# Patient Record
Sex: Male | Born: 1950 | Race: Black or African American | Hispanic: No | Marital: Married | Smoking: Former smoker
Health system: Southern US, Community
[De-identification: ages and names within clinical notes are randomized; demographics above are authoritative.]

## PROBLEM LIST (undated history)

## (undated) DIAGNOSIS — C61 Malignant neoplasm of prostate: Secondary | ICD-10-CM

## (undated) HISTORY — DX: Malignant neoplasm of prostate: C61

## (undated) HISTORY — PX: OTHER SURGICAL HISTORY: SHX169

---

## 1997-12-10 ENCOUNTER — Ambulatory Visit (HOSPITAL_COMMUNITY): Admission: RE | Admit: 1997-12-10 | Discharge: 1997-12-10 | Payer: Self-pay | Admitting: Internal Medicine

## 1997-12-10 ENCOUNTER — Encounter: Payer: Self-pay | Admitting: Internal Medicine

## 2013-05-28 ENCOUNTER — Encounter: Payer: Self-pay | Admitting: Radiation Oncology

## 2013-05-28 DIAGNOSIS — C61 Malignant neoplasm of prostate: Secondary | ICD-10-CM

## 2013-05-28 HISTORY — DX: Malignant neoplasm of prostate: C61

## 2013-05-28 NOTE — Progress Notes (Signed)
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 Louis Meckel for evaluation of elevated PSA  Biopsies of prostate (if applicable) revealed:     Past/Anticipated interventions by urology, if any: discussed surveillance, surgery and made radiation referral  Past/Anticipated interventions by medical oncology, if any: None   Weight changes, if any: Denies  Bowel/Bladder complaints, if any: Denies dysuria or hematuria; IPSS 0 at [facility]   Nausea/Vomiting, if any: Denies  Pain issues, if any:  Denies bone pain, new back pain, or lower extremity edema  SAFETY ISSUES:  Prior radiation? NO  Pacemaker/ICD? NO  Possible current pregnancy? N/A  Is the patient on methotrexate? N/A  Current Complaints / other details:  63 year old male. Married. One daughter. Prostate volume 25.36 cc. NKDA

## 2013-05-29 ENCOUNTER — Ambulatory Visit
Admission: RE | Admit: 2013-05-29 | Discharge: 2013-05-29 | Disposition: A | Discharge status: Home / Self Care | Payer: No Typology Code available for payment source | Source: Ambulatory Visit | Attending: Radiation Oncology | Admitting: Radiation Oncology

## 2013-05-29 ENCOUNTER — Encounter: Payer: Self-pay | Admitting: Radiation Oncology

## 2013-05-29 VITALS — BP 127/84 | HR 100 | Temp 98.0°F | Resp 16 | Ht 68.0 in | Wt 228.5 lb

## 2013-05-29 DIAGNOSIS — C61 Malignant neoplasm of prostate: Secondary | ICD-10-CM | POA: Insufficient documentation

## 2013-05-29 DIAGNOSIS — Z79899 Other long term (current) drug therapy: Secondary | ICD-10-CM | POA: Insufficient documentation

## 2013-05-29 NOTE — Progress Notes (Signed)
Radiation Oncology         [phone] ________________________________  Initial outpatient Consultation  Name: Jesus Preston MRN: [id number]  Date: 05/29/2013  DOB: [birth year]  CC:No primary provider on file.  Ardis Hughs, MD   REFERRING PHYSICIAN: Ardis Hughs, MD  DIAGNOSIS: 63 y.o. gentleman with stage T1c adenocarcinoma of the prostate with a Gleason's score of 3+3 and a PSA of 5.99  HISTORY OF PRESENT ILLNESS::Jesus Preston is a 63 y.o. gentleman.  He was noted to have an elevated PSA of 5.99 by his primary care physician, Dr. Jeanann Lewandowsky.  Accordingly, he was referred for evaluation in urology by Dr. Louis Meckel on 04/08/13,  digital rectal examination was performed at that time revealing no nodules.  The patient proceeded to transrectal ultrasound with 12 biopsies of the prostate on 04/30/13.  The prostate volume measured 25.36 cc.  Out of 12 core biopsies, 4 were positive.  The maximum Gleason score was 3+3, and this was seen in the left gland as illustrated below:    The patient reviewed the biopsy results with his urologist and he has kindly been referred today for discussion of potential radiation treatment options.  PREVIOUS RADIATION THERAPY: No  PAST MEDICAL HISTORY:  has a past medical history of Prostate cancer.    PAST SURGICAL HISTORY: Past Surgical History  Procedure Laterality Date  . Prostate biopsy      FAMILY HISTORY: family history is not on file.  SOCIAL HISTORY:  reports that he quit smoking about 29 years ago. His smoking use included Cigarettes. He smoked 0.00 packs per day. He has never used smokeless tobacco. He reports that he drinks alcohol. He reports that he does not use illicit drugs.  ALLERGIES: Review of patient's allergies indicates no known allergies.  MEDICATIONS:  Current Outpatient Prescriptions  Medication Sig Dispense Refill  . Biotin 1000 MCG tablet Take 1,000 mcg by mouth 3 (three) times daily.       No current  facility-administered medications for this encounter.    REVIEW OF SYSTEMS:  A 15 point review of systems is documented in the electronic medical record. This was obtained by the nursing staff. However, I reviewed this with the patient to discuss relevant findings and make appropriate changes.  A comprehensive review of systems was negative..  The patient completed an IPSS and IIEF questionnaire.  His IPSS score was Zero indicating minimal urinary outflow obstructive symptoms.  He indicated that his erectile function is always able to complete sexual activity.   PHYSICAL EXAM: This patient is in no acute distress.  He is alert and oriented.   height is 5\' 8"  (1.727 m) and weight is 228 lb 8 oz (103.647 kg). His oral temperature is 98 F (36.7 C). His blood pressure is 127/84 and his pulse is 100. His respiration is 16 and oxygen saturation is 100%.  He exhibits no respiratory distress or labored breathing.  He appears neurologically intact.  His mood is pleasant.  His affect is appropriate.  Please note the digital rectal exam findings described above.  KPS = 100  100 - Normal; no complaints; no evidence of disease. 90   - Able to carry on normal activity; minor signs or symptoms of disease. 80   - Normal activity with effort; some signs or symptoms of disease. 34   - Cares for self; unable to carry on normal activity or to do active work. 60   - Requires occasional assistance, but is able to  care for most of his personal needs. 50   - Requires considerable assistance and frequent medical care. 27   - Disabled; requires special care and assistance. 67   - Severely disabled; hospital admission is indicated although death not imminent. 62   - Very sick; hospital admission necessary; active supportive treatment necessary. 10   - Moribund; fatal processes progressing rapidly. 0     - Dead  Karnofsky DA, Abelmann WH, Craver LS and Burchenal JH [phone]) The use of the nitrogen mustards in the  palliative treatment of carcinoma: with particular reference to bronchogenic carcinoma Cancer 1 [phone]   LABORATORY DATA:  No results found for this basename: WBC, HGB, HCT, MCV, PLT   No results found for this basename: NA, K, CL, CO2   No results found for this basename: ALT, AST, GGT, ALKPHOS, BILITOT     RADIOGRAPHY: No results found.    IMPRESSION: This gentleman is a 63 y.o. gentleman with stage T1c adenocarcinoma of the prostate with a Gleason's score of 3+3 and a PSA of 5.99.  His T-Stage, Gleason's Score, and PSA put him into the favorable risk group.  Accordingly he is eligible for a variety of potential treatment options including active surveillance, radical prostatectomy, external beam radiotherapy, or prostate seed implant.  PLAN:Today I reviewed the findings and workup thus far.  We discussed the natural history of prostate cancer.  We reviewed the the implications of T-stage, Gleason's Score, and PSA on decision-making and outcomes in prostate cancer.    First, we discussed active surveillance in terms of potential pros and cons of this approach in his individual situation.  We discussed radiation treatment in the management of prostate cancer with regard to the logistics and delivery of external beam radiation treatment as well as the logistics and delivery of prostate brachytherapy.  We compared and contrasted each of these approaches and also compared these against prostatectomy.  The patient expressed interest in prostate brachytherapy.  I filled out a patient counseling form for him with relevant treatment diagrams and we retained a copy for our records.   The patient would like to proceed with prostate brachytherapy.  I will share my findings with Dr. Louis Meckel and move forward with scheduling the procedure in the near future.     I enjoyed meeting with him today, and will look forward to participating in the care of this very nice gentleman.   I spent 60 minutes face to  face with the patient and more than 50% of that time was spent in counseling and/or coordination of care.   ------------------------------------------------  Sheral Apley. Tammi Klippel, M.D.

## 2013-05-29 NOTE — Progress Notes (Signed)
IPSS 0. Reports that he is not taking any medication at this time. Denies night sweats or weight loss. Denies dysuria or hematuria. Reports a strong steady urine stream. Denies difficulty emptying his bladder. Denies urgency, frequency, incontinence, or nocturia. Denies diarrhea or blood in stool. Denies bone pain.

## 2013-05-29 NOTE — Progress Notes (Signed)
See progress note under physician encounter. 

## 2013-05-30 ENCOUNTER — Telehealth: Payer: Self-pay | Admitting: *Deleted

## 2013-05-30 NOTE — Telephone Encounter (Signed)
CALLED PATIENT TO INFORM OF PRE-SEED APPT. FOR 06-07-13 @ 2:30 PM, SPOKE WITH PATIENT AND HE IS AWARE OF THIS APPT.

## 2013-06-03 ENCOUNTER — Other Ambulatory Visit: Payer: Self-pay | Admitting: Urology

## 2013-06-04 ENCOUNTER — Telehealth: Payer: Self-pay | Admitting: *Deleted

## 2013-06-04 NOTE — Telephone Encounter (Signed)
CALLED PATIENT TO INFORM FOR IMPLANT DATE , SPOKE WITH PATIENT AND HE IS AWARE OF THIS DATE.

## 2013-06-05 ENCOUNTER — Telehealth: Payer: Self-pay | Admitting: *Deleted

## 2013-06-05 NOTE — Telephone Encounter (Signed)
Returned patient's phone call, lvm for a return call 

## 2013-06-06 ENCOUNTER — Telehealth: Payer: Self-pay | Admitting: *Deleted

## 2013-06-06 NOTE — Telephone Encounter (Signed)
Called patient to remind of appt. For 06-07-13, lvm for a return call

## 2013-06-07 ENCOUNTER — Ambulatory Visit (HOSPITAL_COMMUNITY)
Admission: RE | Admit: 2013-06-07 | Discharge: 2013-06-07 | Disposition: A | Discharge status: Home / Self Care | Payer: No Typology Code available for payment source | Source: Ambulatory Visit | Attending: Urology | Admitting: Urology

## 2013-06-07 ENCOUNTER — Ambulatory Visit
Admission: RE | Admit: 2013-06-07 | Discharge: 2013-06-07 | Disposition: A | Discharge status: Home / Self Care | Payer: No Typology Code available for payment source | Source: Ambulatory Visit | Attending: Radiation Oncology | Admitting: Radiation Oncology

## 2013-06-07 DIAGNOSIS — C61 Malignant neoplasm of prostate: Secondary | ICD-10-CM | POA: Insufficient documentation

## 2013-06-07 DIAGNOSIS — Z01818 Encounter for other preprocedural examination: Secondary | ICD-10-CM | POA: Insufficient documentation

## 2013-06-07 NOTE — Progress Notes (Signed)
  Radiation Oncology         [phone] ________________________________  Name: Jesus Preston MRN: [id number]  Date: 06/07/2013  DOB: Jun 30, 1950  SIMULATION AND TREATMENT PLANNING NOTE PUBIC ARCH STUDY  CC:No primary provider on file.  Ardis Hughs, MD  DIAGNOSIS: 63 y.o. gentleman with stage T1c adenocarcinoma of the prostate with a Gleason's score of 3+3 and a PSA of 5.99  COMPLEX SIMULATION:  The patient presented today for evaluation for possible prostate seed implant. He was brought to the radiation planning suite and placed supine on the CT couch. A 3-dimensional image study set was obtained in upload to the planning computer. There, on each axial slice, I contoured the prostate gland. Then, using three-dimensional radiation planning tools I reconstructed the prostate in view of the structures from the transperineal needle pathway to assess for possible pubic arch interference. In doing so, I did not appreciate any pubic arch interference. Also, the patient's prostate volume was estimated based on the drawn structure. The volume was 34 cc.  Given the pubic arch appearance and prostate volume, patient remains a good candidate to proceed with prostate seed implant. Today, he freely provided informed written consent to proceed.    PLAN: The patient will undergo prostate seed implant.   ________________________________  Sheral Apley. Tammi Klippel, M.D.

## 2013-07-25 ENCOUNTER — Telehealth: Payer: Self-pay | Admitting: *Deleted

## 2013-07-25 NOTE — Telephone Encounter (Signed)
Called patient to remind of lab appt. For tomorrow, spoke with patient's wife and she is aware of this appt.

## 2013-07-26 DIAGNOSIS — C61 Malignant neoplasm of prostate: Secondary | ICD-10-CM | POA: Diagnosis present

## 2013-07-26 DIAGNOSIS — Z87891 Personal history of nicotine dependence: Secondary | ICD-10-CM | POA: Diagnosis not present

## 2013-07-26 LAB — CBC
HCT: 43 % (ref 39.0–52.0)
Hemoglobin: 15 g/dL (ref 13.0–17.0)
MCH: 30.7 pg (ref 26.0–34.0)
MCHC: 34.9 g/dL (ref 30.0–36.0)
MCV: 87.9 fL (ref 78.0–100.0)
Platelets: 229 10*3/uL (ref 150–400)
RBC: 4.89 MIL/uL (ref 4.22–5.81)
RDW: 13.2 % (ref 11.5–15.5)
WBC: 4.9 10*3/uL (ref 4.0–10.5)

## 2013-07-26 LAB — PROTIME-INR
INR: 1.04 (ref 0.00–1.49)
Prothrombin Time: 13.4 seconds (ref 11.6–15.2)

## 2013-07-26 LAB — COMPREHENSIVE METABOLIC PANEL
ALT: 35 U/L (ref 0–53)
AST: 36 U/L (ref 0–37)
Albumin: 3.7 g/dL (ref 3.5–5.2)
Alkaline Phosphatase: 53 U/L (ref 39–117)
BUN: 16 mg/dL (ref 6–23)
CO2: 22 mEq/L (ref 19–32)
Calcium: 9.1 mg/dL (ref 8.4–10.5)
Chloride: 103 mEq/L (ref 96–112)
Creatinine, Ser: 1.1 mg/dL (ref 0.50–1.35)
GFR calc Af Amer: 82 mL/min — ABNORMAL LOW (ref >90)
GFR calc non Af Amer: 71 mL/min — ABNORMAL LOW (ref >90)
Glucose, Bld: 106 mg/dL — ABNORMAL HIGH (ref 70–99)
Potassium: 3.9 mEq/L (ref 3.7–5.3)
Sodium: 137 mEq/L (ref 137–147)
Total Bilirubin: 0.5 mg/dL (ref 0.3–1.2)
Total Protein: 7.2 g/dL (ref 6.0–8.3)

## 2013-07-26 LAB — APTT: aPTT: 30 seconds (ref 24–37)

## 2013-07-31 ENCOUNTER — Encounter (HOSPITAL_BASED_OUTPATIENT_CLINIC_OR_DEPARTMENT_OTHER): Payer: Self-pay | Admitting: *Deleted

## 2013-07-31 NOTE — Progress Notes (Signed)
NPO AFTER MN. ARRIVE AT 0600. CURRENT LAB RESULTS, CXR AND EKG IN EPIC AND CHART. WILL DO FLEET ENEMA AM DOS.

## 2013-08-01 ENCOUNTER — Telehealth: Payer: Self-pay | Admitting: *Deleted

## 2013-08-01 NOTE — Telephone Encounter (Signed)
CALLED PATIENT TO REMIND OF PROCEDURE FOR 08-02-13, SPOKE WITH PATIENT'S WIFE CHARLENE AND THEY ARE AWARE OF THIS PROCEDURE.

## 2013-08-02 ENCOUNTER — Ambulatory Visit (HOSPITAL_BASED_OUTPATIENT_CLINIC_OR_DEPARTMENT_OTHER)
Admission: RE | Admit: 2013-08-02 | Discharge: 2013-08-02 | Disposition: A | Discharge status: Home / Self Care | Payer: No Typology Code available for payment source | Source: Ambulatory Visit | Attending: Urology | Admitting: Urology

## 2013-08-02 ENCOUNTER — Ambulatory Visit (HOSPITAL_BASED_OUTPATIENT_CLINIC_OR_DEPARTMENT_OTHER): Payer: No Typology Code available for payment source | Admitting: Anesthesiology

## 2013-08-02 ENCOUNTER — Encounter (HOSPITAL_BASED_OUTPATIENT_CLINIC_OR_DEPARTMENT_OTHER): Payer: Self-pay

## 2013-08-02 ENCOUNTER — Encounter (HOSPITAL_BASED_OUTPATIENT_CLINIC_OR_DEPARTMENT_OTHER): Payer: No Typology Code available for payment source | Admitting: Anesthesiology

## 2013-08-02 ENCOUNTER — Ambulatory Visit (HOSPITAL_COMMUNITY): Payer: No Typology Code available for payment source

## 2013-08-02 ENCOUNTER — Encounter (HOSPITAL_BASED_OUTPATIENT_CLINIC_OR_DEPARTMENT_OTHER)
Admission: RE | Disposition: A | Discharge status: Home / Self Care | Payer: Self-pay | Source: Ambulatory Visit | Attending: Urology

## 2013-08-02 DIAGNOSIS — Z87891 Personal history of nicotine dependence: Secondary | ICD-10-CM | POA: Insufficient documentation

## 2013-08-02 DIAGNOSIS — C61 Malignant neoplasm of prostate: Secondary | ICD-10-CM | POA: Diagnosis not present

## 2013-08-02 HISTORY — DX: Malignant neoplasm of prostate: C61

## 2013-08-02 HISTORY — PX: RADIOACTIVE SEED IMPLANT: SHX5150

## 2013-08-02 SURGERY — INSERTION, RADIATION SOURCE, PROSTATE
Anesthesia: General | Site: Prostate

## 2013-08-02 MED ORDER — MIDAZOLAM HCL 2 MG/2ML IJ SOLN
INTRAMUSCULAR | Status: AC
  Filled 2013-08-02: qty 2

## 2013-08-02 MED ORDER — HYDROCODONE-ACETAMINOPHEN 5-325 MG PO TABS
1.0000 | ORAL_TABLET | Freq: Four times a day (QID) | ORAL | Status: AC | PRN
  Administered 2013-08-02: 1 via ORAL
  Filled 2013-08-02: qty 1

## 2013-08-02 MED ORDER — ONDANSETRON HCL 4 MG/2ML IJ SOLN
INTRAMUSCULAR | Status: DC | PRN
  Administered 2013-08-02: 4 mg via INTRAVENOUS

## 2013-08-02 MED ORDER — STERILE WATER FOR IRRIGATION IR SOLN
Status: DC | PRN
  Administered 2013-08-02: 1000 mL

## 2013-08-02 MED ORDER — STERILE WATER FOR IRRIGATION IR SOLN
Status: DC | PRN
  Administered 2013-08-02: 1

## 2013-08-02 MED ORDER — PROPOFOL 10 MG/ML IV BOLUS
INTRAVENOUS | Status: DC | PRN
  Administered 2013-08-02: 200 mg via INTRAVENOUS

## 2013-08-02 MED ORDER — FLEET ENEMA 7-19 GM/118ML RE ENEM
1.0000 | ENEMA | Freq: Once | RECTAL | Status: DC
  Filled 2013-08-02: qty 1

## 2013-08-02 MED ORDER — CIPROFLOXACIN HCL 500 MG PO TABS: 500.0000 mg | ORAL_TABLET | Freq: Two times a day (BID) | ORAL | Status: DC

## 2013-08-02 MED ORDER — FENTANYL CITRATE 0.05 MG/ML IJ SOLN
INTRAMUSCULAR | Status: AC
  Filled 2013-08-02: qty 4

## 2013-08-02 MED ORDER — CIPROFLOXACIN IN D5W 400 MG/200ML IV SOLN
400.0000 mg | INTRAVENOUS | Status: AC
  Administered 2013-08-02: 400 mg via INTRAVENOUS
  Filled 2013-08-02: qty 200

## 2013-08-02 MED ORDER — FENTANYL CITRATE 0.05 MG/ML IJ SOLN
INTRAMUSCULAR | Status: DC | PRN
  Administered 2013-08-02 (×4): 50 ug via INTRAVENOUS

## 2013-08-02 MED ORDER — HYDROMORPHONE HCL PF 1 MG/ML IJ SOLN
0.2500 mg | INTRAMUSCULAR | Status: DC | PRN
  Filled 2013-08-02: qty 1

## 2013-08-02 MED ORDER — HYDROCODONE-ACETAMINOPHEN 10-325 MG PO TABS: 1.0000 | ORAL_TABLET | ORAL | Status: AC | PRN

## 2013-08-02 MED ORDER — MIDAZOLAM HCL 5 MG/5ML IJ SOLN
INTRAMUSCULAR | Status: DC | PRN
  Administered 2013-08-02: 2 mg via INTRAVENOUS

## 2013-08-02 MED ORDER — HYDROCODONE-ACETAMINOPHEN 5-325 MG PO TABS
ORAL_TABLET | ORAL | Status: AC
  Filled 2013-08-02: qty 1

## 2013-08-02 MED ORDER — DEXAMETHASONE SODIUM PHOSPHATE 4 MG/ML IJ SOLN
INTRAMUSCULAR | Status: DC | PRN
  Administered 2013-08-02: 10 mg via INTRAVENOUS

## 2013-08-02 MED ORDER — LIDOCAINE HCL (CARDIAC) 20 MG/ML IV SOLN
INTRAVENOUS | Status: DC | PRN
  Administered 2013-08-02: 100 mg via INTRAVENOUS

## 2013-08-02 MED ORDER — KETOROLAC TROMETHAMINE 30 MG/ML IJ SOLN
15.0000 mg | Freq: Once | INTRAMUSCULAR | Status: DC | PRN
  Filled 2013-08-02: qty 1

## 2013-08-02 MED ORDER — LACTATED RINGERS IV SOLN
INTRAVENOUS | Status: DC
  Administered 2013-08-02 (×2): via INTRAVENOUS
  Filled 2013-08-02: qty 1000

## 2013-08-02 MED ORDER — KETOROLAC TROMETHAMINE 30 MG/ML IJ SOLN
INTRAMUSCULAR | Status: DC | PRN
  Administered 2013-08-02: 30 mg via INTRAVENOUS

## 2013-08-02 MED ORDER — IOHEXOL 350 MG/ML SOLN
INTRAVENOUS | Status: DC | PRN
Start: 2013-08-02 — End: 2013-08-02
  Administered 2013-08-02: 7 mL

## 2013-08-02 MED ORDER — ACETAMINOPHEN 10 MG/ML IV SOLN
INTRAVENOUS | Status: DC | PRN
  Administered 2013-08-02: 1000 mg via INTRAVENOUS

## 2013-08-02 MED ORDER — PROMETHAZINE HCL 25 MG/ML IJ SOLN
6.2500 mg | INTRAMUSCULAR | Status: DC | PRN
  Filled 2013-08-02: qty 1

## 2013-08-02 SURGICAL SUPPLY — 32 items
BAG URINE DRAINAGE (UROLOGICAL SUPPLIES) ×2 IMPLANT
BLADE SURG ROTATE 9660 (MISCELLANEOUS) ×2 IMPLANT
CATH FOLEY 2WAY SLVR  5CC 16FR (CATHETERS) ×2
CATH FOLEY 2WAY SLVR 5CC 16FR (CATHETERS) ×2 IMPLANT
CATH ROBINSON RED A/P 20FR (CATHETERS) ×2 IMPLANT
CLOTH BEACON ORANGE TIMEOUT ST (SAFETY) ×2 IMPLANT
COVER MAYO STAND STRL (DRAPES) ×2 IMPLANT
COVER TABLE BACK 60X90 (DRAPES) ×2 IMPLANT
DRSG TEGADERM 4X4.75 (GAUZE/BANDAGES/DRESSINGS) ×2 IMPLANT
DRSG TEGADERM 8X12 (GAUZE/BANDAGES/DRESSINGS) ×2 IMPLANT
GLOVE BIO SURGEON STRL SZ7.5 (GLOVE) IMPLANT
GLOVE BIO SURGEON STRL SZ8 (GLOVE) ×4 IMPLANT
GLOVE BIOGEL M 7.0 STRL (GLOVE) ×1 IMPLANT
GLOVE BIOGEL PI IND STRL 7.5 (GLOVE) IMPLANT
GLOVE BIOGEL PI IND STRL 8 (GLOVE) IMPLANT
GLOVE BIOGEL PI INDICATOR 7.5 (GLOVE) ×2
GLOVE BIOGEL PI INDICATOR 8 (GLOVE) ×2
GLOVE ECLIPSE 8.0 STRL XLNG CF (GLOVE) ×4 IMPLANT
GOWN STRL REIN XL XLG (GOWN DISPOSABLE) ×1 IMPLANT
GOWN STRL REUS W/TWL 2XL LVL3 (GOWN DISPOSABLE) ×2 IMPLANT
GOWN STRL REUS W/TWL XL LVL3 (GOWN DISPOSABLE) ×1 IMPLANT
GOWN XL W/COTTON TOWEL STD (GOWNS) ×1 IMPLANT
HOLDER FOLEY CATH W/STRAP (MISCELLANEOUS) ×2 IMPLANT
IV NS IRRIG 3000ML ARTHROMATIC (IV SOLUTION) IMPLANT
IV WATER IRR. 1000ML (IV SOLUTION) ×2 IMPLANT
PACK CYSTOSCOPY (CUSTOM PROCEDURE TRAY) ×2 IMPLANT
Prostate seeds (Urological Implant) ×71 IMPLANT
SPONGE GAUZE 4X4 12PLY STER LF (GAUZE/BANDAGES/DRESSINGS) ×1 IMPLANT
SYRINGE 10CC LL (SYRINGE) ×2 IMPLANT
UNDERPAD 30X30 INCONTINENT (UNDERPADS AND DIAPERS) ×4 IMPLANT
WATER STERILE IRR 3000ML UROMA (IV SOLUTION) IMPLANT
WATER STERILE IRR 500ML POUR (IV SOLUTION) ×2 IMPLANT

## 2013-08-02 NOTE — H&P (Signed)
History of Present Illness     This 63 year old male referred by Dr. Jeanann Lewandowsky, M.D. for evaluation and management of an elevated PSA.  Patient was found to have an elevated PSA which was drawn as part of a prostate cancer screening. He has no family history of prostate cancer. The patient denies any bone pain, new back pain, or lower extremity edema. The patient denies any changes in his voiding symptoms over the last 6 months. Specifically he denies dysuria or hematuria.    PSA History:  5.99 on 03/05/13  IPSS:0,0  SHIM: 25     Prostate cancer:  Stage: T1c  PSA : 5.99  Biopsy , 4/12 cores positive: Gleason 3+3 = 6 and 3/12 cores in the left lateral lobe, 1/12 cores Gleason 3+3 left medial mid  Prostate volume: 25.36 cc    Prostate cancer nomogram:  OC- 77%  ECE- 18%  SVI- 1%  LNI -1%  PFS (surgery)- 93% at 7 years, 88% at 10 years     Surgical History Problems  1. History of No Surgical Problems  Current Meds 1. Hair/Skin/Nails/Biotin TABS;  Therapy: (Recorded:05Jan2015) to Recorded  Allergies Medication  1. No Known Drug Allergies  Family History Problems  1. Family history of Respiratory complication : Father  Social History Problems  1. Alcohol use 2. Caffeine use (V49.89) 3. Death in the family, father   age 59 respiratory problems 4. Former smoker (V15.82)   quit 1986 5. Married 6. Number of children   1 daughter  Review of Systems Genitourinary, constitutional, skin, eye, otolaryngeal, hematologic/lymphatic, cardiovascular, pulmonary, endocrine, musculoskeletal, gastrointestinal, neurological and psychiatric system(s) were reviewed and pertinent findings if present are noted.    Vitals Vital Signs  Blood Pressure: 132 / 86 Temperature: 98.8 F Heart Rate: 97  Physical Exam Constitutional:1  Well nourished1  and well developed1  . No acute distress1 .  ENT:1 . The ears and nose are normal in appearance1 .  Neck:1  The  appearance of the neck is normal1  and no neck mass is present1 .  Pulmonary:1  No respiratory distress1  and normal respiratory rhythm and effort1 .  Cardiovascular:1  Heart rate and rhythm are normal1  . No peripheral edema.1 .  Abdomen: The abdomen is soft and nontender1  No masses are palpated1  No CVA tenderness1 . No hernias are palpable1  No hepatosplenomegaly noted1   Rectal: Rectal exam demonstrates1  normal sphincter tone1 , no tenderness1  and no masses1 . The prostate1  has no nodularity1  and is not tender1 . The left seminal vesicle is1  nonpalpable1 . The right seminal vesicle is1  nonpalpable1 . The perineum is normal on inspection1 .  Genitourinary: Examination of the penis demonstrates1  no discharge1 , no masses1 , no lesions1  and a normal meatus1 . The scrotum is1  without lesions1 . The right epididymis is1  palpably normal1  and non-tender1 . The left epididymis is1  palpably normal1  and non-tender1 . The right testis is1  non-tender1  and without masses1 . The left testis is1  non-tender1  and without masses1 .  Lymphatics: The  femoral1  and  inguinal1  nodes are not enlarged or tender1 .  Skin:1  Normal skin turgor1 , no visible rash1  and no visible skin lesions1 .  Neuro/Psych:1 . Mood and affect are appropriate1 .      Assessment T1c Gleason 3+3, low risk prostate cancer   Plan Brachytherapy   Discussion Patient is  an excellent brachytherapy candidate given his low risk prostate cancer profile.   The patient was counseled about the natural history of prostate cancer and the standard treatment options that are available for prostate cancer. It was explained to him how his age and life expectancy, clinical stage, Gleason score, and PSA affect his prognosis, the decision to proceed with additional staging studies, as well as how that information influences recommended treatment strategies. We discussed the roles for active surveillance, radiation therapy, surgical  therapy, androgen deprivation, as well as ablative therapy options for the treatment of prostate cancer as appropriate to his individual cancer situation. We discussed the risks and benefits of these options with regard to their impact on cancer control and also in terms of potential adverse events, complications, and impact on quiality of life particularly related to urinary, bowel, and sexual function. The patient was encouraged to ask questions throughout the discussion today and all questions were answered to his stated satisfaction. In addition, the patient was provided with and/or directed to appropriate resources and literature for further education about prostate cancer and treatment options.   We discussed surgical therapy for prostate cancer including the different available surgical approaches. We discussed, in detail, the risks and expectations of surgery with regard to cancer control, urinary control, and erectile function as well as the expected postoperative recovery process. The risks, potential complications/adverse events of radical prostatectomy as well as alternative options were explained to the patient.

## 2013-08-02 NOTE — Transfer of Care (Signed)
Immediate Anesthesia Transfer of Care Note  Patient: Jesus Preston  Procedure(s) Performed: Procedure(s) with comments: RADIOACTIVE SEED IMPLANT (N/A) - DR portable  Patient Location: PACU  Anesthesia Type:General  Level of Consciousness: sedated  Airway & Oxygen Therapy: Patient Spontanous Breathing and Patient connected to face mask oxygen  Post-op Assessment: Report given to PACU RN and Post -op Vital signs reviewed and stable  Post vital signs: stable  Complications: No apparent anesthesia complications

## 2013-08-02 NOTE — Anesthesia Postprocedure Evaluation (Signed)
  Anesthesia Post-op Note  Patient: Jesus Preston  Procedure(s) Performed: Procedure(s) (LRB): RADIOACTIVE SEED IMPLANT (N/A)  Patient Location: PACU  Anesthesia Type: General  Level of Consciousness: awake and alert   Airway and Oxygen Therapy: Patient Spontanous Breathing  Post-op Pain: mild  Post-op Assessment: Post-op Vital signs reviewed, Patient's Cardiovascular Status Stable, Respiratory Function Stable, Patent Airway and No signs of Nausea or vomiting  Last Vitals:  Filed Vitals:   08/02/13 0950  BP:   Pulse: 59  Temp:   Resp: 16    Post-op Vital Signs: stable   Complications: No apparent anesthesia complications

## 2013-08-02 NOTE — Discharge Instructions (Addendum)
DISCHARGE INSTRUCTIONS FOR PROSTATE SEED IMPLANTATION ° °Removal of catheter °Remove the foley catheter after 24 hours ( day after the procedure).can be done easily by cutting the side port of the catheter, whichallow the balloon to deflate.  You will see 1-2 teaspoons of clear water as the balloon deflates and then the catheter can be slid out without difficulty. ° ° °     Cut here ° °Antibiotics °You may be given a prescription for an antibiotic to take when you arrive home. If so, be sure to take every tablet in the bottle, even if you are feeling better before the prescription is finished. If you begin itching, notice a rash or start to swell on your trunk, arms, legs and/or throat, immediately stop taking the antibiotic and call your Urologist. °Diet °Resume your usual diet when you return home. To keep your bowels moving easily and softly, drink prune, apple and cranberry juice at room temperature. You may also take a stool softener, such as Colace, which is available without prescription at local pharmacies. °Daily activities °  No driving or heavy lifting for at least two days after the implant. °  No bike riding, horseback riding or riding lawn mowers for the first month after the implant. °  Any strenuous physical activity should be approved by your doctor before you resume it. °Sexual relations °You may resume sexual relations two weeks after the procedure. A condom should be used for the first two weeks. Your semen may be dark brown or black; this is normal and is related bleeding that may have occurred during the implant. °Postoperative swelling °Expect swelling and bruising of the scrotum and perineum (the area between the scrotum and anus). Both the swelling and the bruising should resolve in l or 2 weeks. Ice packs and over- the-counter medications such as Tylenol, Advil or Aleve may lessen your discomfort. °Postoperative urination °Most men experience burning on urination and/or urinary frequency.  If this becomes bothersome, contact your Urologist.  Medication can be prescribed to relieve these problems.  It is normal to have some blood in your urine for a few days after the implant. °Special instructions related to the seeds °It is unlikely that you will pass an Iodine-125 seed in your urine. The seeds are silver in color and are about as large as a grain of rice. If you pass a seed, do not handle it with your fingers. Use a spoon to place it in an envelope or jar in place this in base occluded area such as the garage or basement for return to the radiation clinic at your convenience. ° °Contact your doctor for °  Temperature greater than 101 F °  Increasing pain °  Inability to urinate °Follow-up ° You should have follow up with your urologist and radiation oncologist about 3 weeks after the procedure. °General information regarding Iodine seeds °  Iodine-125 is a low energy radioactive material. It is not deeply penetrating and loses energy at short distances. Your prostate will absorb the radiation. Objects that are touched or used by the patient do not become radioactive. °  Body wastes (urine and stool) or body fluids (saliva, tears, semen or blood) are not radioactive. °  The Nuclear Regulatory Commission (NRC) has determined that no radiation precautions are needed for patients undergoing Iodine-125 seed implantation. The NRC states that such patients do not present a risk to the people around them, including young children and pregnant women. However, in keeping with the general principle   that radiation exposure should be kept as low reasonably possible, we suggest the following: °  Children and pets should not sit on the patient's lap for the first two (2) weeks after the implant. °  Pregnant (or possibly pregnant) women should avoid prolonged, close contact with the patient for the first two (2) weeks after the implant. °  A distance of three (3) feet is acceptable. °At a distance of three (3)  feet, there is no limit to the length of time anyone can be with the patient. °Post Anesthesia Home Care Instructions ° °Activity: °Get plenty of rest for the remainder of the day. A responsible adult should stay with you for 24 hours following the procedure.  °For the next 24 hours, DO NOT: °-Drive a car °-Operate machinery °-Drink alcoholic beverages °-Take any medication unless instructed by your physician °-Make any legal decisions or sign important papers. ° °Meals: °Start with liquid foods such as gelatin or soup. Progress to regular foods as tolerated. Avoid greasy, spicy, heavy foods. If nausea and/or vomiting occur, drink only clear liquids until the nausea and/or vomiting subsides. Call your physician if vomiting continues. ° °Special Instructions/Symptoms: °Your throat may feel dry or sore from the anesthesia or the breathing tube placed in your throat during surgery. If this causes discomfort, gargle with warm salt water. The discomfort should disappear within 24 hours. °   °

## 2013-08-02 NOTE — Anesthesia Procedure Notes (Signed)
Procedure Name: LMA Insertion Date/Time: 08/02/2013 7:43 AM Performed by: Mechele Claude Pre-anesthesia Checklist: Patient identified, Emergency Drugs available, Suction available and Patient being monitored Patient Re-evaluated:Patient Re-evaluated prior to inductionOxygen Delivery Method: Circle System Utilized Preoxygenation: Pre-oxygenation with 100% oxygen Intubation Type: IV induction Ventilation: Mask ventilation without difficulty LMA: LMA inserted LMA Size: 5.0 Number of attempts: 1 Airway Equipment and Method: bite block Placement Confirmation: positive ETCO2 Tube secured with: Tape Dental Injury: Teeth and Oropharynx as per pre-operative assessment

## 2013-08-02 NOTE — Anesthesia Preprocedure Evaluation (Signed)
Anesthesia Evaluation  Patient identified by MRN, date of birth, ID band Patient awake    Reviewed: Allergy & Precautions, H&P , NPO status , Patient's Chart, lab work & pertinent test results  Airway       Dental   Pulmonary neg pulmonary ROS, former smoker,          Cardiovascular negative cardio ROS      Neuro/Psych negative neurological ROS  negative psych ROS   GI/Hepatic negative GI ROS, Neg liver ROS,   Endo/Other  negative endocrine ROS  Renal/GU negative Renal ROS  negative genitourinary   Musculoskeletal negative musculoskeletal ROS (+)   Abdominal   Peds negative pediatric ROS (+)  Hematology negative hematology ROS (+)   Anesthesia Other Findings   Reproductive/Obstetrics negative OB ROS                           Anesthesia Physical Anesthesia Plan  ASA: I  Anesthesia Plan: General   Post-op Pain Management:    Induction: Intravenous  Airway Management Planned: LMA  Additional Equipment:   Intra-op Plan:   Post-operative Plan:   Informed Consent: I have reviewed the patients History and Physical, chart, labs and discussed the procedure including the risks, benefits and alternatives for the proposed anesthesia with the patient or authorized representative who has indicated his/her understanding and acceptance.   Dental advisory given  Plan Discussed with: CRNA and Surgeon  Anesthesia Plan Comments:         Anesthesia Quick Evaluation

## 2013-08-02 NOTE — Op Note (Signed)
PATIENT:  Jesus Preston  PRE-OPERATIVE DIAGNOSIS:  Adenocarcinoma of the prostate  POST-OPERATIVE DIAGNOSIS:  Same  PROCEDURE:  Procedure(s): 1. I-125 radioactive seed implantation 2. Cystoscopy  SURGEON:  Surgeon(s): Tyson Dense  Radiation oncologist: Dr. Tyler Pita  ANESTHESIA:  General  EBL:  Minimal  DRAINS: 44 French Foley catheter  INDICATION: Jesus Preston is a 63 year old male patient who was found to have an elevated PSA of 5.99. No abnormality was noted on DRE. He underwent prostate biopsy which revealed 4/12 cores positive for Gleason 3+3 = 6 adenocarcinoma. The treatment options were discussed in detail and the patient has elected to proceed with radioactive seed implantation.  Description of procedure: After informed consent the patient was brought to the major OR, placed on the table and administered general anesthesia. He was then moved to the modified lithotomy position with his perineum perpendicular to the floor. His perineum and genitalia were then sterilely prepped. An official timeout was then performed. A 16 French Foley catheter was then placed in the bladder and filled with dilute contrast, a rectal tube was placed in the rectum and the transrectal ultrasound probe was placed in the rectum and affixed to the stand. He was then sterilely draped.  Real time ultrasonography was used along with the seed planning software spot-pro version 3.1-00. This was used to develop the seed plan including the number of needles as well as number of seeds required for complete and adequate coverage. Real-time ultrasonography was then used along with the previously developed plan and the Nucletron device to implant a total of 71 seeds using 21 needles. This proceeded without difficulty or complication.  A Foley catheter was then removed as well as the transrectal ultrasound probe and rectal probe. Flexible cystoscopy was then performed using the 17 French  flexible scope which revealed a normal urethra throughout its length down to the sphincter which appeared intact. The prostatic urethra revealed bilobar hypertrophy but no evidence of obstruction, seeds, spacers or lesions. The bladder was then entered and fully and systematically inspected. The ureteral orifices were noted to be of normal configuration and position. The mucosa revealed no evidence of tumors. There were also no stones identified within the bladder. I noted no seeds or spacers on the floor of the bladder and retroflexion of the scope revealed no seeds protruding from the base of the prostate.  The cystoscope was then removed and a new 78 French Foley catheter was then inserted and the balloon was filled with 10 cc of sterile water. This was connected to closed system drainage and the patient was awakened and taken to recovery room in stable and satisfactory condition. He tolerated procedure well and there were no intraoperative complications.

## 2013-08-02 NOTE — Procedures (Signed)
  Radiation Oncology         [phone] ________________________________  Name: Jesus Preston MRN: [id number]  Date: 08/02/2013  DOB: [birth year]       Prostate Seed Implant  MG:QQPYP,PJKDTOI S, MD  No ref. provider found  DIAGNOSIS: 63 y.o. gentleman with stage T1c adenocarcinoma of the prostate with a Gleason's score of 3+3 and a PSA of 5.99  PROCEDURE: Insertion of radioactive I-125 seeds into the prostate gland.  RADIATION DOSE: 145 Gy, definitive therapy.  TECHNIQUE: Jesus Preston was brought to the operating room with the urologist. He was placed in the dorsolithotomy position. He was catheterized and a rectal tube was inserted. The perineum was shaved, prepped and draped. The ultrasound probe was then introduced into the rectum to see the prostate gland.  TREATMENT DEVICE: A needle grid was attached to the ultrasound probe stand and anchor needles were placed.  3D PLANNING: The prostate was imaged in 3D using a sagittal sweep of the prostate probe. These images were transferred to the planning computer. There, the prostate, urethra and rectum were defined on each axial reconstructed image. Then, the software created an optimized 3D plan and a few seed positions were adjusted. The quality of the plan was reviewed using [facility] information for the target and the following two organs at risk:  Urethra and Rectum.  Then the accepted plan was uploaded to the seed Selectron afterloading unit.  PROSTATE VOLUME STUDY:  Using transrectal ultrasound the volume of the prostate was verified to be 35.09 cc.  SPECIAL TREATMENT PROCEDURE/SUPERVISION AND HANDLING: The Nucletron FIRST system was used to place the needles under sagittal guidance. A total of 21 needles were used to deposit 71 seeds in the prostate gland. The individual seed activity was 0.423 mCi for a total implant activity of 30.0330 mCi.  COMPLEX SIMULATION: At the end of the procedure, an anterior radiograph of the pelvis was  obtained to document seed positioning and count. Cystoscopy was performed to check the urethra and bladder.  MICRODOSIMETRY: At the end of the procedure, the patient was emitting 0.06 mrem/hr at 1 meter. Accordingly, he was considered safe for hospital discharge.  PLAN: The patient will return to the radiation oncology clinic for post implant CT dosimetry in three weeks.   ________________________________  Sheral Apley Tammi Klippel, M.D.

## 2013-08-05 ENCOUNTER — Encounter (HOSPITAL_BASED_OUTPATIENT_CLINIC_OR_DEPARTMENT_OTHER): Payer: Self-pay | Admitting: Urology

## 2013-08-05 HISTORY — PX: CHG INTERSTI RADIOELEM APPL COMPLX: 77778

## 2013-08-21 ENCOUNTER — Telehealth: Payer: Self-pay | Admitting: *Deleted

## 2013-08-21 NOTE — Telephone Encounter (Signed)
CALLED PATIENT TO INFORM OF CHANGE OF APPTS. FOR 08-23-13, SPOKE WITH PATIENT AND HE IS AWARE OF THESE APPT. CHANGES

## 2013-08-21 NOTE — Telephone Encounter (Signed)
XXXX 

## 2013-08-23 ENCOUNTER — Encounter: Payer: Self-pay | Admitting: Radiation Oncology

## 2013-08-23 ENCOUNTER — Ambulatory Visit
Admission: RE | Admit: 2013-08-23 | Discharge: 2013-08-23 | Disposition: A | Discharge status: Home / Self Care | Payer: No Typology Code available for payment source | Source: Ambulatory Visit | Attending: Radiation Oncology | Admitting: Radiation Oncology

## 2013-08-23 VITALS — BP 128/68 | HR 69 | Resp 16 | Wt 225.0 lb

## 2013-08-23 DIAGNOSIS — C61 Malignant neoplasm of prostate: Secondary | ICD-10-CM

## 2013-08-23 NOTE — Progress Notes (Signed)
  Radiation Oncology         [phone] ________________________________  Name: Jesus Preston MRN: [id number]  Date: 08/23/2013  DOB: November 04, 1950  Follow-Up Visit Note  CC: Foye Spurling, MD  Ardis Hughs, MD  Diagnosis:   63 y.o. gentleman with stage T1c adenocarcinoma of the prostate with a Gleason's score of 3+3 and a PSA of 5.99  Interval Since Last Radiation:  3  weeks  Narrative:  The patient returns today for routine follow-up.  He is complaining of increased urinary frequency and urinary hesitation symptoms. He filled out a questionnaire regarding urinary function today providing and overall IPSS score of 6 characterizing his symptoms as mild. He denies any bowel symptoms.  ALLERGIES:  has No Known Allergies.  Meds: Current Outpatient Prescriptions  Medication Sig Dispense Refill  . HYDROcodone-acetaminophen (NORCO) 10-325 MG per tablet Take 1-2 tablets by mouth every 4 (four) hours as needed for moderate pain. Maximum dose per 24 hours - 8 pills  16 tablet  0  . silodosin (RAPAFLO) 4 MG CAPS capsule Take 4 mg by mouth daily with breakfast.       No current facility-administered medications for this encounter.    Physical Findings: The patient is in no acute distress. Patient is alert and oriented.  weight is 225 lb (102.059 kg). His blood pressure is 128/68 and his pulse is 69. His respiration is 16. .  No significant changes.  Lab Findings: Lab Results  Component Value Date   WBC 4.9 07/26/2013   HGB 15.0 07/26/2013   HCT 43.0 07/26/2013   MCV 87.9 07/26/2013   PLT 229 07/26/2013    Radiographic Findings:  Patient underwent CT imaging in our clinic for post implant dosimetry. The CT appears to demonstrate an adequate distribution of radioactive seeds throughout the prostate gland. There no seeds in her near the rectum. I suspect the final radiation plan and dosimetry will show appropriate coverage of the prostate gland.   Impression: The patient is  recovering from the effects of radiation. His urinary symptoms should gradually improve over the next 4-6 months. We talked about this today. He is encouraged by his improvement already and is otherwise please with his outcome.   Plan: Today, I spent time talking to the patient about his prostate seed implant and resolving urinary symptoms. We also talked about long-term follow-up for prostate cancer following seed implant. He understands that ongoing PSA determinations and digital rectal exams will help perform surveillance to rule out disease recurrence. He understands what to expect with his PSA measures. Patient was also educated today about some of the long-term effects from radiation including a small risk for rectal bleeding and possibly erectile dysfunction. We talked about some of the general management approaches to these potential complications. However, I did encourage the patient to contact our office or return at any point if he has questions or concerns related to his previous radiation and prostate cancer.  _____________________________________  Sheral Apley. Tammi Klippel, M.D.

## 2013-08-23 NOTE — Progress Notes (Signed)
  Radiation Oncology         [phone] ________________________________  Name: Jesus Preston MRN: [id number]  Date: 08/23/2013  DOB: May 03, 1950  COMPLEX SIMULATION NOTE  NARRATIVE:  The patient was brought to the [place] suite today following prostate seed implantation approximately one month ago.  Identity was confirmed.  All relevant records and images related to the planned course of therapy were reviewed.  Then, the patient was set-up supine.  CT images were obtained.  The CT images were loaded into the planning software.  Then the prostate and rectum were contoured.  Treatment planning then occurred.  The implanted iodine 125 seeds were identified by the physics staff for projection of radiation distribution  I have requested : 3D Simulation  I have requested a DVH of the following structures: Prostate and rectum.    ________________________________  Sheral Apley Tammi Klippel, M.D.

## 2013-08-23 NOTE — Progress Notes (Signed)
Patient presented to the clinic today follow post seed sim. Vitals stable. Patient denies pain. Denies hematuria. Denies diarrhea. Reports slight burn with urination. Denies nocturia. Reports he feels strong and has plenty of energy. Reports difficulty with urination when standing but, denies any when sitting. Patient given rapaflo to use prn if this symptom doesn't resolve over the next two weeks. IPSS 6 post seed.

## 2013-09-04 ENCOUNTER — Encounter (HOSPITAL_BASED_OUTPATIENT_CLINIC_OR_DEPARTMENT_OTHER): Payer: Self-pay | Admitting: Urology

## 2013-09-12 ENCOUNTER — Ambulatory Visit
Admission: RE | Admit: 2013-09-12 | Discharge: 2013-09-12 | Disposition: A | Discharge status: Home / Self Care | Payer: No Typology Code available for payment source | Source: Ambulatory Visit | Attending: Radiation Oncology | Admitting: Radiation Oncology

## 2013-09-12 ENCOUNTER — Encounter: Payer: Self-pay | Admitting: Radiation Oncology

## 2013-09-12 DIAGNOSIS — Z510 Encounter for antineoplastic radiation therapy: Secondary | ICD-10-CM | POA: Insufficient documentation

## 2013-09-12 DIAGNOSIS — C61 Malignant neoplasm of prostate: Secondary | ICD-10-CM | POA: Diagnosis not present

## 2013-09-16 NOTE — Progress Notes (Signed)
  Radiation Oncology         [phone] ________________________________  Name: TAMARIUS ROSENFIELD MRN: [id number]  Date: 09/12/2013  DOB: June 30, 1950  Complex Isodose Planning Note Prostate Brachytherapy  Diagnosis: 63 y.o. gentleman with stage T1c adenocarcinoma of the prostate with a Gleason's score of 3+3 and a PSA of 5.99  Narrative: On a previous date, JEMUEL LAURSEN returned following prostate seed implantation for post implant planning. He underwent CT scan complex simulation to delineate the three-dimensional structures of the pelvis and demonstrate the radiation distribution.  Since that time, the seed localization, and complex isodose planning with dose volume histograms have now been completed.  Results:   Prostate Coverage - The dose of radiation delivered to the 90% or more of the prostate gland (D90) was 107% of the prescription dose. This exceeds our goal of greater than 90%. Rectal Sparing - The volume of rectal tissue receiving the prescription dose or higher was 0.0 cc. This falls under our thresholds tolerance of 1.0 cc.  Impression: The prostate seed implant appears to show adequate target coverage and appropriate rectal sparing.  Plan:  The patient will continue to follow with urology for ongoing PSA determinations. I would anticipate a high likelihood for local tumor control with minimal risk for rectal morbidity.  ________________________________  Sheral Apley Tammi Klippel, M.D.

## 2015-02-12 IMAGING — CR DG CHEST 2V
2 series · 2 of 2 positions shown · non-contrast
Comparison: None.

CLINICAL DATA: Preoperative films.  Prostate cancer.

EXAM:
CHEST  2 VIEW

[w chest pa]
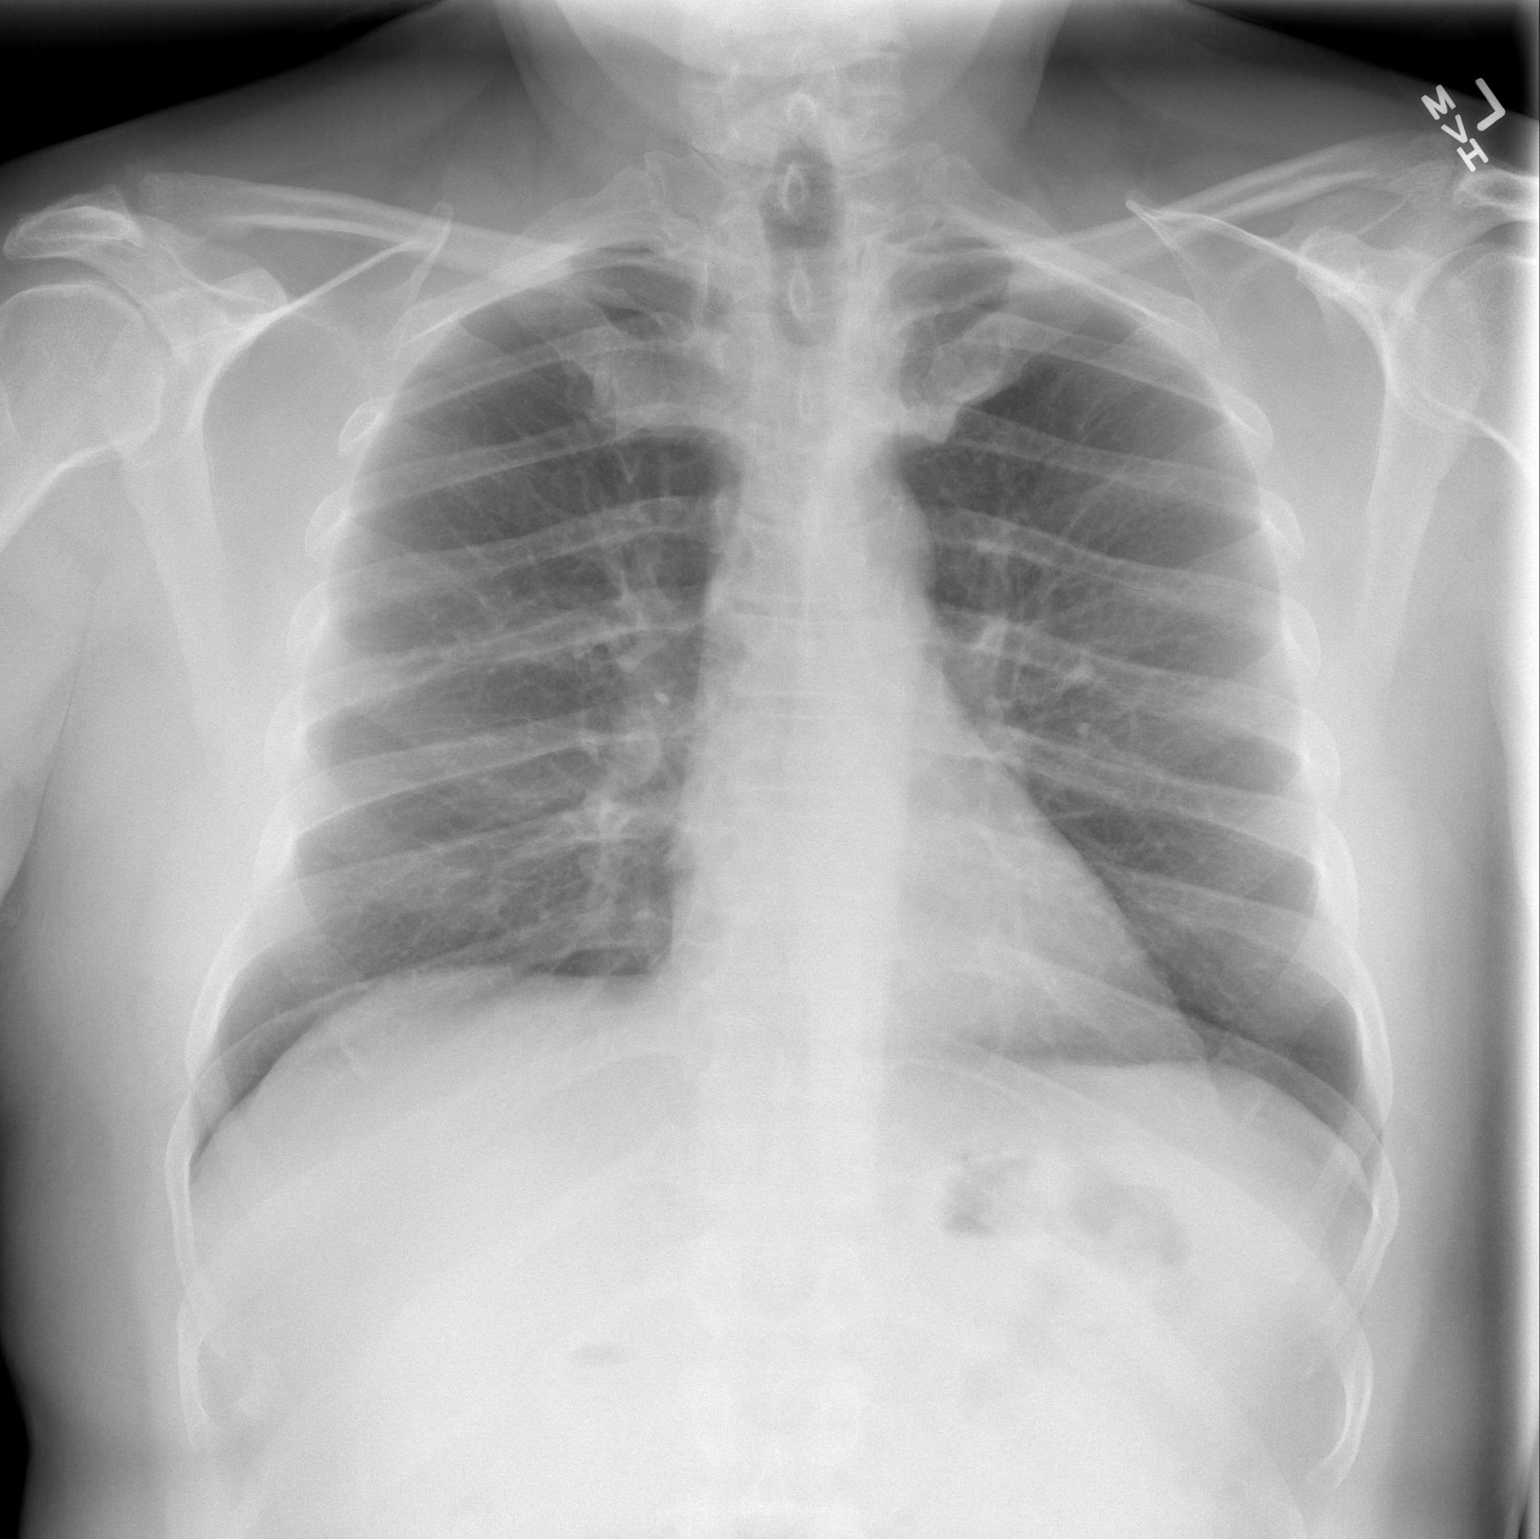

[w chest lat]
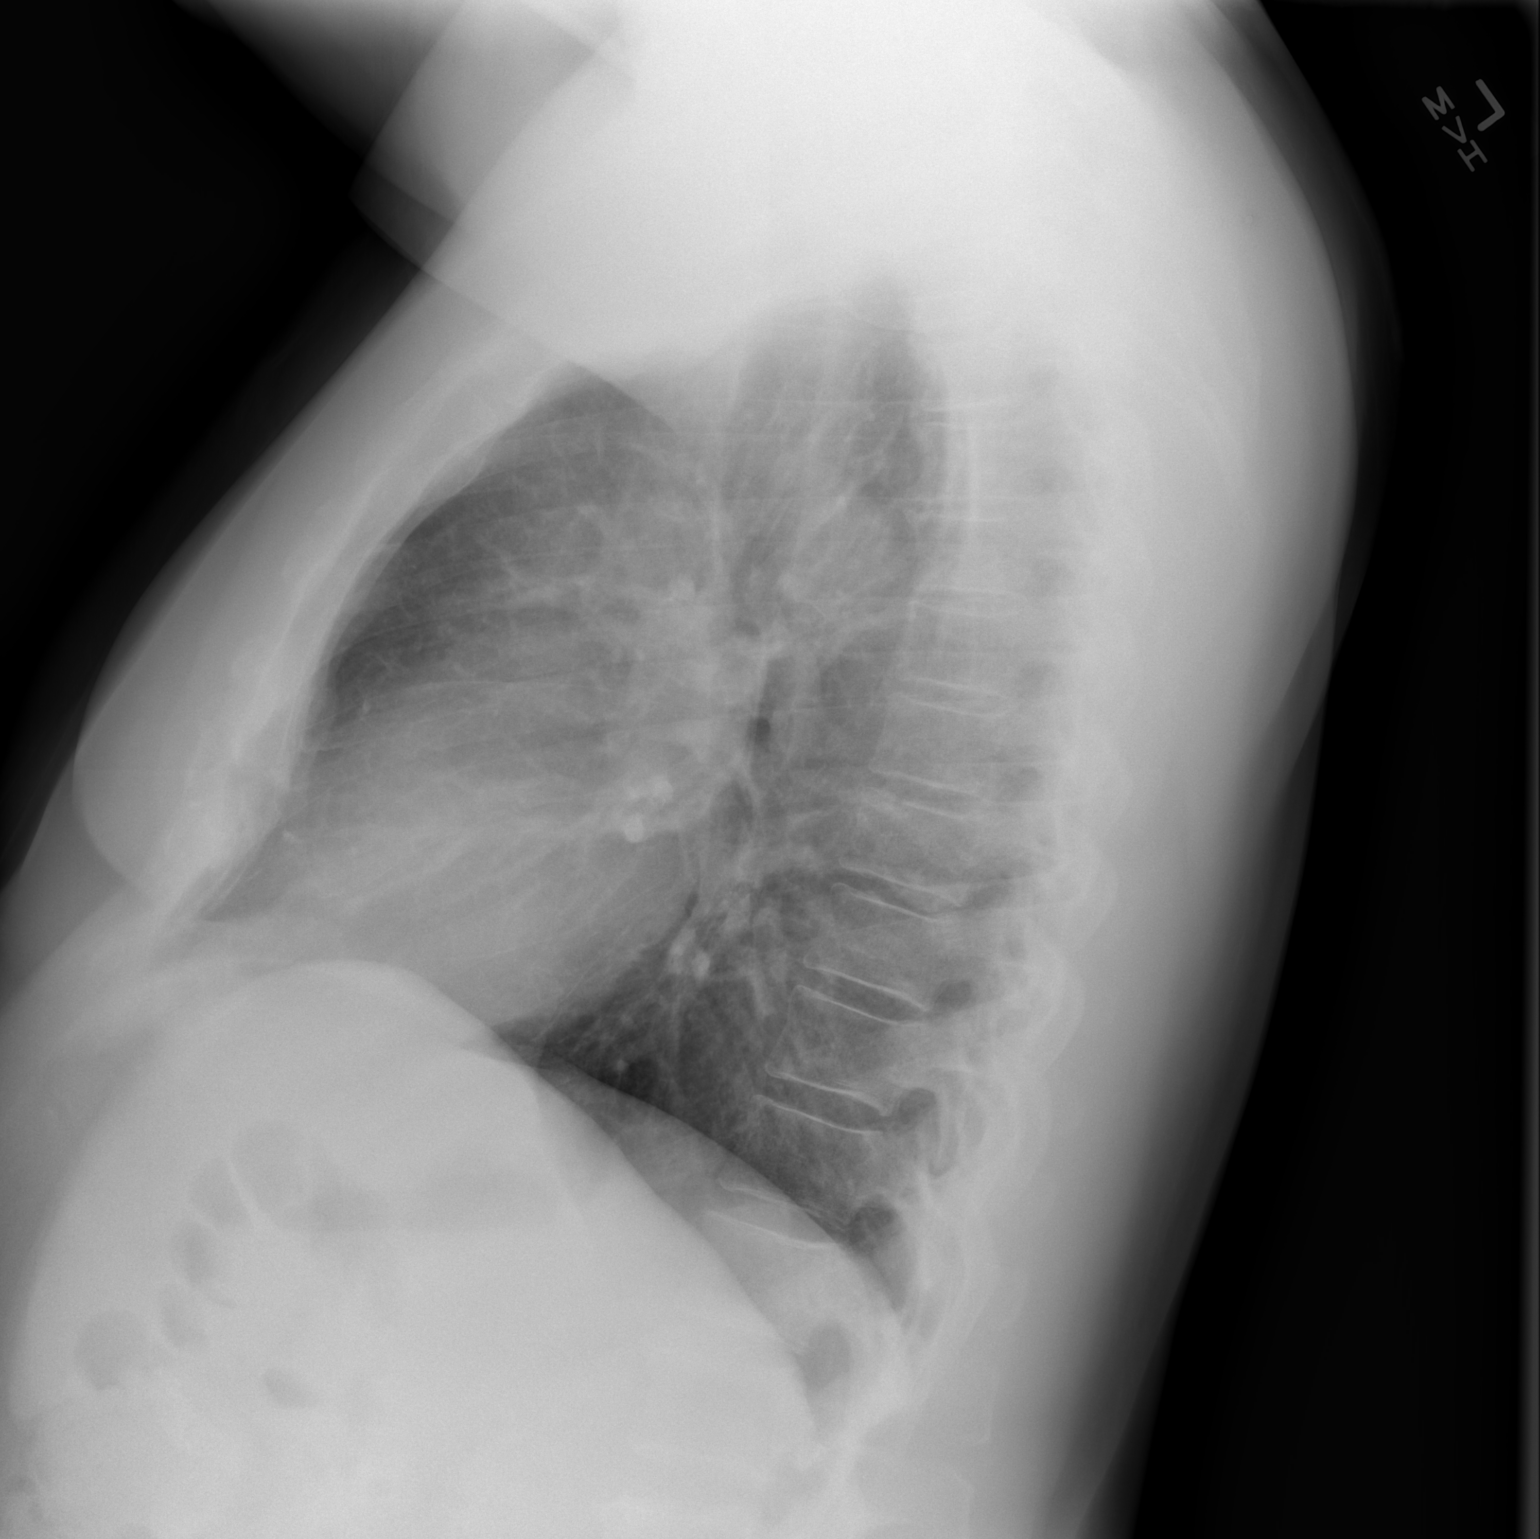

[2 of 2 positions shown; findings below may reference images not displayed]

FINDINGS: Lungs are clear. Heart size is normal. No pneumothorax or pleural
fluid.
IMPRESSION: No acute disease.

## 2019-05-20 ENCOUNTER — Ambulatory Visit: Payer: No Typology Code available for payment source | Attending: Internal Medicine

## 2023-01-16 ENCOUNTER — Ambulatory Visit: Payer: Self-pay | Admitting: Family Medicine

## 2023-01-17 ENCOUNTER — Encounter: Payer: Self-pay | Admitting: Family Medicine

## 2023-01-17 ENCOUNTER — Ambulatory Visit (INDEPENDENT_AMBULATORY_CARE_PROVIDER_SITE_OTHER): Payer: BC Managed Care – PPO | Admitting: Family Medicine

## 2023-01-17 VITALS — BP 149/79 | HR 72 | Temp 98.7°F | Resp 16 | Ht 68.0 in | Wt 223.8 lb

## 2023-01-17 DIAGNOSIS — Z130 Encounter for screening for diseases of the blood and blood-forming organs and certain disorders involving the immune mechanism: Secondary | ICD-10-CM

## 2023-01-17 DIAGNOSIS — Z0000 Encounter for general adult medical examination without abnormal findings: Secondary | ICD-10-CM

## 2023-01-17 DIAGNOSIS — Z7689 Persons encountering health services in other specified circumstances: Secondary | ICD-10-CM | POA: Diagnosis not present

## 2023-01-17 DIAGNOSIS — Z13220 Encounter for screening for lipoid disorders: Secondary | ICD-10-CM

## 2023-01-17 NOTE — Progress Notes (Unsigned)
New Patient Office Visit  Subjective    Patient ID: Jesus Preston, male    DOB: [birth year]  Age: 72 y.o. MRN: [id number]  CC:  Chief Complaint  Patient presents with   Establish Care    physical    HPI Jesus Preston presents to establish care and for routine annual exam. Patient denies acute complaints or concerns.    Outpatient Encounter Medications as of 01/17/2023  Medication Sig   HYDROcodone-acetaminophen (NORCO) 10-325 MG per tablet Take 1-2 tablets by mouth every 4 (four) hours as needed for moderate pain. Maximum dose per 24 hours - 8 pills (Patient not taking: Reported on 01/17/2023)   silodosin (RAPAFLO) 4 MG CAPS capsule Take 4 mg by mouth daily with breakfast. (Patient not taking: Reported on 01/17/2023)   No facility-administered encounter medications on file as of 01/17/2023.    Past Medical History:  Diagnosis Date   Prostate cancer [facility])    stage T1c  gleason  3+3   stage T1c adenocarcinoma of the prostate with a Gleason's score of 3+3 and a PSA of 5.99 05/28/2013    Past Surgical History:  Procedure Laterality Date   CHG INTERSTITIAL RADIATION SOURCE APPLIC COMPLEX  08/05/2013       prostate biopsy     RADIOACTIVE SEED IMPLANT N/A 08/02/2013   Procedure: RADIOACTIVE SEED IMPLANT;  Surgeon: Garnett Farm, MD;  Location: [facility];  Service: Urology;  Laterality: N/A;  DR portable    History reviewed. No pertinent family history.  Social History   Socioeconomic History   Marital status: Married    Spouse name: Not on file   Number of children: Not on file   Years of education: Not on file   Highest education level: Not on file  Occupational History   Not on file  Tobacco Use   Smoking status: Former    Current packs/day: 0.00    Types: Cigarettes    Quit date: 04/04/1993    Years since quitting: 29.8   Smokeless tobacco: Never   Tobacco comments:    PER PT FORMER SOCIAL SMOKER  Substance and Sexual Activity   Alcohol use:  Yes    Comment: OCCASIONAL   Drug use: No   Sexual activity: Not on file  Other Topics Concern   Not on file  Social History Narrative   Not on file   Social Determinants of Health   Financial Resource Strain: Low Risk  (01/17/2023)   Overall Financial Resource Strain (CARDIA)    Difficulty of Paying Living Expenses: Not hard at all  Food Insecurity: No Food Insecurity (01/17/2023)   Hunger Vital Sign    Worried About Running Out of Food in the Last Year: Never true    Ran Out of Food in the Last Year: Never true  Transportation Needs: No Transportation Needs (01/17/2023)   PRAPARE - Administrator, Civil Service (Medical): No    Lack of Transportation (Non-Medical): No  Physical Activity: Inactive (01/17/2023)   Exercise Vital Sign    Days of Exercise per Week: 0 days    Minutes of Exercise per Session: 0 min  Stress: No Stress Concern Present (01/17/2023)   [employer] of Occupational Health - Occupational Stress Questionnaire    Feeling of Stress : Not at all  Social Connections: Moderately Integrated (01/17/2023)   Social Connection and Isolation Panel [NHANES]    Frequency of Communication with Friends and Family: More than three times a week  Frequency of Social Gatherings with Friends and Family: Once a week    Attends Religious Services: More than 4 times per year    Active Member of [employer] or Organizations: No    Attends Banker Meetings: Never    Marital Status: Married  Catering manager Violence: Not At Risk (01/17/2023)   Humiliation, Afraid, Rape, and Kick questionnaire    Fear of Current or Ex-Partner: No    Emotionally Abused: No    Physically Abused: No    Sexually Abused: No    Review of Systems  All other systems reviewed and are negative.       Objective    BP (!) 149/79 (BP Location: Right Arm, Patient Position: Sitting, Cuff Size: Large)   Pulse 72   Temp 98.7 F (37.1 C) (Oral)   Resp 16   Ht 5\' 8"  (1.727  m)   Wt 223 lb 12.8 oz (101.5 kg)   SpO2 94%   BMI 34.03 kg/m   Physical Exam Vitals and nursing note reviewed.  Constitutional:      General: He is not in acute distress. HENT:     Head: Normocephalic and atraumatic.     Right Ear: Tympanic membrane, ear canal and external ear normal.     Left Ear: Tympanic membrane, ear canal and external ear normal.     Nose: Nose normal.     Mouth/Throat:     Mouth: Mucous membranes are moist.     Pharynx: Oropharynx is clear.  Eyes:     Conjunctiva/sclera: Conjunctivae normal.     Pupils: Pupils are equal, round, and reactive to light.  Neck:     Thyroid: No thyromegaly.  Cardiovascular:     Rate and Rhythm: Normal rate and regular rhythm.     Heart sounds: Normal heart sounds. No murmur heard. Pulmonary:     Effort: Pulmonary effort is normal.     Breath sounds: Normal breath sounds.  Abdominal:     General: There is no distension.     Palpations: Abdomen is soft. There is no mass.     Tenderness: There is no abdominal tenderness.     Hernia: There is no hernia in the left inguinal area or right inguinal area.  Genitourinary:    Penis: Normal.      Testes: Normal.  Musculoskeletal:        General: Normal range of motion.     Cervical back: Normal range of motion and neck supple.     Right lower leg: No edema.     Left lower leg: No edema.  Skin:    General: Skin is warm and dry.  Neurological:     General: No focal deficit present.     Mental Status: He is alert and oriented to person, place, and time. Mental status is at baseline.  Psychiatric:        Mood and Affect: Mood normal.        Behavior: Behavior normal.     {Labs (Optional):23779}    Assessment & Plan:   Annual physical exam -     CMP14+EGFR  Encounter to establish care  Screening for deficiency anemia -     CBC with Differential/Platelet  Screening for lipid disorders -     Lipid panel     No follow-ups on file.   Tommie Raymond, MD

## 2023-01-20 LAB — LIPID PANEL
Chol/HDL Ratio: 4 {ratio} (ref 0.0–5.0)
Cholesterol, Total: 223 mg/dL — ABNORMAL HIGH (ref 100–199)
HDL: 56 mg/dL (ref >39)
LDL Chol Calc (NIH): 151 mg/dL — ABNORMAL HIGH (ref 0–99)
Triglycerides: 88 mg/dL (ref 0–149)
VLDL Cholesterol Cal: 16 mg/dL (ref 5–40)

## 2023-01-20 LAB — SPECIMEN STATUS REPORT

## 2024-01-17 ENCOUNTER — Encounter: Payer: Self-pay | Admitting: Family Medicine

## 2024-01-17 ENCOUNTER — Ambulatory Visit: Payer: BC Managed Care – PPO | Admitting: Family Medicine

## 2024-01-17 VITALS — BP 129/81 | HR 61 | Ht 68.0 in | Wt 220.8 lb

## 2024-01-17 DIAGNOSIS — Z1211 Encounter for screening for malignant neoplasm of colon: Secondary | ICD-10-CM

## 2024-01-17 DIAGNOSIS — Z130 Encounter for screening for diseases of the blood and blood-forming organs and certain disorders involving the immune mechanism: Secondary | ICD-10-CM | POA: Diagnosis not present

## 2024-01-17 DIAGNOSIS — Z136 Encounter for screening for cardiovascular disorders: Secondary | ICD-10-CM | POA: Diagnosis not present

## 2024-01-17 DIAGNOSIS — Z0000 Encounter for general adult medical examination without abnormal findings: Secondary | ICD-10-CM | POA: Diagnosis not present

## 2024-01-17 DIAGNOSIS — Z1329 Encounter for screening for other suspected endocrine disorder: Secondary | ICD-10-CM | POA: Diagnosis not present

## 2024-01-17 DIAGNOSIS — Z13228 Encounter for screening for other metabolic disorders: Secondary | ICD-10-CM

## 2024-01-17 DIAGNOSIS — Z1159 Encounter for screening for other viral diseases: Secondary | ICD-10-CM

## 2024-01-18 ENCOUNTER — Encounter: Payer: Self-pay | Admitting: Family Medicine

## 2024-01-18 LAB — CMP14+EGFR
ALT: 10 IU/L (ref 0–44)
AST: 21 IU/L (ref 0–40)
Albumin: 4 g/dL (ref 3.8–4.8)
Alkaline Phosphatase: 70 IU/L (ref 47–123)
BUN/Creatinine Ratio: 19 (ref 10–24)
BUN: 21 mg/dL (ref 8–27)
Bilirubin Total: 0.7 mg/dL (ref 0.0–1.2)
CO2: 21 mmol/L (ref 20–29)
Calcium: 9.4 mg/dL (ref 8.6–10.2)
Chloride: 102 mmol/L (ref 96–106)
Creatinine, Ser: 1.13 mg/dL (ref 0.76–1.27)
Globulin, Total: 3.3 g/dL (ref 1.5–4.5)
Glucose: 79 mg/dL (ref 70–99)
Potassium: 4.4 mmol/L (ref 3.5–5.2)
Sodium: 139 mmol/L (ref 134–144)
Total Protein: 7.3 g/dL (ref 6.0–8.5)
eGFR: 69 mL/min/1.73 (ref >59)

## 2024-01-18 LAB — CBC WITH DIFFERENTIAL/PLATELET
Basophils Absolute: 0.1 x10E3/uL (ref 0.0–0.2)
Basos: 1 %
EOS (ABSOLUTE): 0.1 x10E3/uL (ref 0.0–0.4)
Eos: 1 %
Hematocrit: 49.3 % (ref 37.5–51.0)
Hemoglobin: 16 g/dL (ref 13.0–17.7)
Immature Grans (Abs): 0 x10E3/uL (ref 0.0–0.1)
Immature Granulocytes: 0 %
Lymphocytes Absolute: 1.8 x10E3/uL (ref 0.7–3.1)
Lymphs: 30 %
MCH: 30.7 pg (ref 26.6–33.0)
MCHC: 32.5 g/dL (ref 31.5–35.7)
MCV: 95 fL (ref 79–97)
Monocytes Absolute: 0.3 x10E3/uL (ref 0.1–0.9)
Monocytes: 6 %
Neutrophils Absolute: 3.7 x10E3/uL (ref 1.4–7.0)
Neutrophils: 62 %
Platelets: 250 x10E3/uL (ref 150–450)
RBC: 5.21 x10E6/uL (ref 4.14–5.80)
RDW: 12.9 % (ref 11.6–15.4)
WBC: 6 x10E3/uL (ref 3.4–10.8)

## 2024-01-18 LAB — LIPID PANEL
Chol/HDL Ratio: 3.6 ratio (ref 0.0–5.0)
Cholesterol, Total: 222 mg/dL — ABNORMAL HIGH (ref 100–199)
HDL: 61 mg/dL (ref >39)
LDL Chol Calc (NIH): 152 mg/dL — ABNORMAL HIGH (ref 0–99)
Triglycerides: 50 mg/dL (ref 0–149)
VLDL Cholesterol Cal: 9 mg/dL (ref 5–40)

## 2024-01-18 LAB — HEMOGLOBIN A1C
Est. average glucose Bld gHb Est-mCnc: 114 mg/dL
Hgb A1c MFr Bld: 5.6 % (ref 4.8–5.6)

## 2024-01-18 LAB — HEPATITIS C ANTIBODY: Hep C Virus Ab: NONREACTIVE

## 2024-01-18 LAB — VITAMIN D 25 HYDROXY (VIT D DEFICIENCY, FRACTURES): Vit D, 25-Hydroxy: 23.4 ng/mL — ABNORMAL LOW (ref 30.0–100.0)

## 2024-01-18 NOTE — Progress Notes (Signed)
 Established Patient Office Visit  Subjective    Patient ID: Jesus Preston, male    DOB: 06/20/1950  Age: 73 y.o. MRN: [id number]  CC:  Chief Complaint  Patient presents with   Annual Exam    HPI Jesus Preston presents for routine annual exam. Patient denies acute complaints.   Outpatient Encounter Medications as of 01/17/2024  Medication Sig   HYDROcodone -acetaminophen  (NORCO) 10-325 MG per tablet Take 1-2 tablets by mouth every 4 (four) hours as needed for moderate pain. Maximum dose per 24 hours - 8 pills (Patient not taking: Reported on 01/17/2023)   silodosin (RAPAFLO) 4 MG CAPS capsule Take 4 mg by mouth daily with breakfast. (Patient not taking: Reported on 01/17/2023)   No facility-administered encounter medications on file as of 01/17/2024.    Past Medical History:  Diagnosis Date   Prostate cancer [facility])    stage T1c  gleason  3+3   stage T1c adenocarcinoma of the prostate with a Gleason's score of 3+3 and a PSA of 5.99 05/28/2013    Past Surgical History:  Procedure Laterality Date   CHG INTERSTITIAL RADIATION SOURCE APPLIC COMPLEX  08/05/2013       prostate biopsy     RADIOACTIVE SEED IMPLANT N/A 08/02/2013   Procedure: RADIOACTIVE SEED IMPLANT;  Surgeon: Oneil JAYSON Rafter, MD;  Location: [facility];  Service: Urology;  Laterality: N/A;  DR portable    History reviewed. No pertinent family history.  Social History   Socioeconomic History   Marital status: Married    Spouse name: Not on file   Number of children: Not on file   Years of education: Not on file   Highest education level: Not on file  Occupational History   Not on file  Tobacco Use   Smoking status: Former    Current packs/day: 0.00    Types: Cigarettes    Quit date: 04/04/1993    Years since quitting: 30.8   Smokeless tobacco: Never   Tobacco comments:    PER PT FORMER SOCIAL SMOKER  Substance and Sexual Activity   Alcohol use: Yes    Comment: OCCASIONAL   Drug use: No    Sexual activity: Not on file  Other Topics Concern   Not on file  Social History Narrative   Not on file   Social Drivers of Health   Financial Resource Strain: Low Risk  (01/17/2023)   Overall Financial Resource Strain (CARDIA)    Difficulty of Paying Living Expenses: Not hard at all  Food Insecurity: No Food Insecurity (01/17/2023)   Hunger Vital Sign    Worried About Running Out of Food in the Last Year: Never true    Ran Out of Food in the Last Year: Never true  Transportation Needs: No Transportation Needs (01/17/2023)   PRAPARE - Administrator, Civil Service (Medical): No    Lack of Transportation (Non-Medical): No  Physical Activity: Inactive (01/17/2023)   Exercise Vital Sign    Days of Exercise per Week: 0 days    Minutes of Exercise per Session: 0 min  Stress: No Stress Concern Present (01/17/2023)   [employer] of Occupational Health - Occupational Stress Questionnaire    Feeling of Stress : Not at all  Social Connections: Moderately Integrated (01/17/2023)   Social Connection and Isolation Panel    Frequency of Communication with Friends and Family: More than three times a week    Frequency of Social Gatherings with Friends and Family: Once  a week    Attends Religious Services: More than 4 times per year    Active Member of Clubs or Organizations: No    Attends Banker Meetings: Never    Marital Status: Married  Catering manager Violence: Not At Risk (01/17/2023)   Humiliation, Afraid, Rape, and Kick questionnaire    Fear of Current or Ex-Partner: No    Emotionally Abused: No    Physically Abused: No    Sexually Abused: No    Review of Systems  All other systems reviewed and are negative.       Objective    BP 129/81   Pulse 61   Ht 5' 8 (1.727 m)   Wt 220 lb 12.8 oz (100.2 kg)   SpO2 93%   BMI 33.57 kg/m   Physical Exam Vitals and nursing note reviewed.  Constitutional:      General: He is not in acute  distress. HENT:     Head: Normocephalic and atraumatic.     Right Ear: Tympanic membrane, ear canal and external ear normal.     Left Ear: Tympanic membrane, ear canal and external ear normal.     Nose: Nose normal.     Mouth/Throat:     Mouth: Mucous membranes are moist.     Pharynx: Oropharynx is clear.  Eyes:     Conjunctiva/sclera: Conjunctivae normal.     Pupils: Pupils are equal, round, and reactive to light.  Neck:     Thyroid: No thyromegaly.  Cardiovascular:     Rate and Rhythm: Normal rate and regular rhythm.     Heart sounds: Normal heart sounds. No murmur heard. Pulmonary:     Effort: Pulmonary effort is normal.     Breath sounds: Normal breath sounds.  Abdominal:     General: There is no distension.     Palpations: Abdomen is soft. There is no mass.     Tenderness: There is no abdominal tenderness.     Hernia: There is no hernia in the left inguinal area or right inguinal area.  Musculoskeletal:        General: Normal range of motion.     Cervical back: Normal range of motion and neck supple.     Right lower leg: No edema.     Left lower leg: No edema.  Skin:    General: Skin is warm and dry.  Neurological:     General: No focal deficit present.     Mental Status: He is alert and oriented to person, place, and time. Mental status is at baseline.  Psychiatric:        Mood and Affect: Mood normal.        Behavior: Behavior normal.         Assessment & Plan:   Annual physical exam -     CMP14+EGFR  Screening for deficiency anemia -     CBC with Differential/Platelet  Encounter for screening for cardiovascular disorders -     Lipid panel  Screening for endocrine/metabolic/immunity disorders -     VITAMIN D 25 Hydroxy (Vit-D Deficiency, Fractures) -     Hemoglobin A1c  Screening for colon cancer  Need for hepatitis C screening test -     Hepatitis C antibody     No follow-ups on file.   Tanda Raguel SQUIBB, MD

## 2024-02-14 ENCOUNTER — Ambulatory Visit: Payer: Self-pay | Admitting: Family Medicine

## 2024-02-14 MED ORDER — VITAMIN D (ERGOCALCIFEROL) 1.25 MG (50000 UNIT) PO CAPS: 50000.0000 [IU] | ORAL_CAPSULE | ORAL | 0 refills | Status: AC
